# Patient Record
Sex: Female | Born: 1990 | Race: White | Hispanic: No | Marital: Single | Smoking: Never smoker
Health system: Southern US, Community
[De-identification: ages and names within clinical notes are randomized; demographics above are authoritative.]

## PROBLEM LIST (undated history)

## (undated) DIAGNOSIS — N289 Disorder of kidney and ureter, unspecified: Secondary | ICD-10-CM

## (undated) HISTORY — PX: TONSILLECTOMY: SUR1361

---

## 2012-05-17 ENCOUNTER — Encounter (HOSPITAL_COMMUNITY): Payer: Self-pay | Admitting: Emergency Medicine

## 2012-05-17 ENCOUNTER — Emergency Department (HOSPITAL_COMMUNITY): Payer: BC Managed Care – PPO

## 2012-05-17 ENCOUNTER — Emergency Department (HOSPITAL_COMMUNITY)
Admission: EM | Admit: 2012-05-17 | Discharge: 2012-05-17 | Disposition: A | Discharge status: Home / Self Care | Payer: BC Managed Care – PPO | Attending: Emergency Medicine | Admitting: Emergency Medicine

## 2012-05-17 DIAGNOSIS — R319 Hematuria, unspecified: Secondary | ICD-10-CM | POA: Insufficient documentation

## 2012-05-17 DIAGNOSIS — R109 Unspecified abdominal pain: Secondary | ICD-10-CM

## 2012-05-17 DIAGNOSIS — Z87442 Personal history of urinary calculi: Secondary | ICD-10-CM | POA: Insufficient documentation

## 2012-05-17 DIAGNOSIS — R300 Dysuria: Secondary | ICD-10-CM | POA: Insufficient documentation

## 2012-05-17 HISTORY — DX: Disorder of kidney and ureter, unspecified: N28.9

## 2012-05-17 LAB — POCT I-STAT, CHEM 8
BUN: 15 mg/dL (ref 6–23)
Calcium, Ion: 1.25 mmol/L — ABNORMAL HIGH (ref 1.12–1.23)
Chloride: 106 mEq/L (ref 96–112)
Creatinine, Ser: 1 mg/dL (ref 0.50–1.10)
Glucose, Bld: 91 mg/dL (ref 70–99)
HCT: 38 % (ref 36.0–46.0)
Hemoglobin: 12.9 g/dL (ref 12.0–15.0)
Potassium: 4 mEq/L (ref 3.5–5.1)
Sodium: 138 mEq/L (ref 135–145)
TCO2: 25 mmol/L (ref 0–100)

## 2012-05-17 LAB — URINALYSIS, ROUTINE W REFLEX MICROSCOPIC
Bilirubin Urine: NEGATIVE
Glucose, UA: NEGATIVE mg/dL
Ketones, ur: NEGATIVE mg/dL
Leukocytes, UA: NEGATIVE
Nitrite: NEGATIVE
Protein, ur: NEGATIVE mg/dL
Specific Gravity, Urine: <1.005 — ABNORMAL LOW (ref 1.005–1.030)
Urobilinogen, UA: 0.2 mg/dL (ref 0.0–1.0)
pH: 6 (ref 5.0–8.0)

## 2012-05-17 LAB — URINE MICROSCOPIC-ADD ON

## 2012-05-17 LAB — PREGNANCY, URINE: Preg Test, Ur: NEGATIVE

## 2012-05-17 MED ORDER — HYDROMORPHONE HCL PF 1 MG/ML IJ SOLN
1.0000 mg | Freq: Once | INTRAMUSCULAR | Status: AC
  Administered 2012-05-17: 1 mg via INTRAMUSCULAR
  Filled 2012-05-17: qty 1

## 2012-05-17 MED ORDER — IBUPROFEN 400 MG PO TABS
600.0000 mg | ORAL_TABLET | Freq: Once | ORAL | Status: AC
  Administered 2012-05-17: 600 mg via ORAL
  Filled 2012-05-17: qty 2

## 2012-05-17 NOTE — ED Provider Notes (Signed)
History     This chart was scribed for Raeford Razor, MD, MD by Smitty Pluck, ED Scribe. The patient was seen in room APA19/APA19 and the patient's care was started at 3:28 PM.   CSN: [id number]  Arrival date & time 05/17/12  1514      Chief Complaint  Patient presents with  . Flank Pain    The history is provided by the patient and a parent. No language interpreter was used.   Michele Wong is a 22 y.o. female with hx of kidney stones who presents to the Emergency Department complaining of intermittent, moderate left flank pain onset 4 days ago. Pt reports that she passed a kidney stone 4 days ago. She reports that she normally passes the kidney stones. She states she was seen at [facility] center and diagnosed with kidney stones. She reports that different movements aggravates the pain. She mentions having mild hematuria, dysuria and difficulty urinating. She has taken Lortab without relief. Pt denies fever, chills, nausea, vomiting, diarrhea, weakness, cough, SOB and any other pain.   Past Medical History  Diagnosis Date  . Renal disorder     Past Surgical History  Procedure Laterality Date  . Tonsillectomy      History reviewed. No pertinent family history.  History  Substance Use Topics  . Smoking status: Never Smoker   . Smokeless tobacco: Not on file  . Alcohol Use: Yes     Comment: occ    OB History   Grav Para Term Preterm Abortions TAB SAB Ect Mult Living                  Review of Systems  All other systems reviewed and are negative.    Allergies  Review of patient's allergies indicates not on file.  Home Medications  No current outpatient prescriptions on file.  BP 121/78  Pulse 106  Temp(Src) 97.7 F (36.5 C) (Oral)  Resp 24  Ht 5\' 6"  (1.676 m)  Wt 113 lb (51.256 kg)  BMI 18.25 kg/m2  SpO2 100%  LMP 04/29/2012  Physical Exam  Nursing note and vitals reviewed. Constitutional: She appears well-developed and well-nourished. No distress.   HENT:  Head: Normocephalic and atraumatic.  Eyes: Conjunctivae are normal. Right eye exhibits no discharge. Left eye exhibits no discharge.  Neck: Neck supple.  Cardiovascular: Normal rate, regular rhythm and normal heart sounds.  Exam reveals no gallop and no friction rub.   No murmur heard. Pulmonary/Chest: Effort normal and breath sounds normal. No respiratory distress.  Abdominal: Soft. She exhibits no distension. There is no tenderness. There is no CVA tenderness.  Musculoskeletal: She exhibits no edema and no tenderness.  Neurological: She is alert.  Skin: Skin is warm and dry.  Psychiatric: She has a normal mood and affect. Her behavior is normal. Thought content normal.    ED Course  Procedures (including critical care time) DIAGNOSTIC STUDIES: Oxygen Saturation is 100% on room air, normal by my interpretation.    COORDINATION OF CARE: 3:31 PM Discussed ED treatment with pt and pt agrees.     Labs Reviewed  URINALYSIS, ROUTINE W REFLEX MICROSCOPIC - Abnormal; Notable for the following:    Specific Gravity, Urine <1.005 (*)    Hgb urine dipstick MODERATE (*)    All other components within normal limits  POCT I-STAT, CHEM 8 - Abnormal; Notable for the following:    Calcium, Ion 1.25 (*)    All other components within normal limits  PREGNANCY, URINE  URINE MICROSCOPIC-ADD ON   Ct Abdomen Pelvis Wo Contrast  05/17/2012  *RADIOLOGY REPORT*  Clinical Data: Left flank pain.  History of kidney stones.  CT ABDOMEN AND PELVIS WITHOUT CONTRAST  Technique:  Multidetector CT imaging of the abdomen and pelvis was performed following the standard protocol without intravenous contrast.  Comparison: None.  Findings: The lung bases are clear.  There is no pleural effusion.  There are several small renal calyceal calculi bilaterally.  Both renal pelves are mildly prominent.  However, there is no ureteral dilatation or perinephric soft tissue stranding.  There is no evidence of ureteral or  bladder calculus.  No focal renal abnormalities are identified.  As evaluated in the noncontrast state, the liver, spleen, gallbladder, pancreas and adrenal glands appear normal.  There is high density stool throughout the colon.  No bowel wall thickening or surrounding inflammatory change is seen.  The appendix appears normal.  The uterus and ovaries appear normal.  There is a small amount of free pelvic fluid.  IMPRESSION:  1.  Bilateral nephrolithiasis. 2.  No evidence of hydronephrosis, ureteral calculus or other acute process.   Original Report Authenticated By: Carey Bullocks, M.D.      1. Left flank pain       MDM  21yf with continued L flank after passing kidney stone. CT with b/l renal stones but no ureterolithiasis. Renal function fine. UA not suggestive of infection. Possibly continued ureteral spasm. Symptomatic tx with NSAIDs. Return precautions discussed. Outpt FU otherwise.      I personally preformed the services scribed in my presence. The recorded information has been reviewed is accurate. Raeford Razor, MD.    Raeford Razor, MD 05/17/12 [phone]

## 2012-05-17 NOTE — ED Notes (Signed)
Dr Kohut at bedside,  

## 2012-05-17 NOTE — ED Notes (Signed)
Went in to room to discharge pt, pt states that she needs stronger pain medication, pt taking lortab 5/325mg  with no improvement in pain, Dr. Juleen China back to room speaking with pt and family member,

## 2012-05-17 NOTE — ED Notes (Signed)
Pt has hx of kidney stones. C/o L flank pain since Wednesday and passed one then. Pain started back Friday. Urine hard to come out. Nad at this time.

## 2012-05-17 NOTE — ED Notes (Signed)
Pt c/o left flank pain, states that she passed a kidney stone this past Wednesday, was seen at [facility], pain returned today,

## 2013-03-25 ENCOUNTER — Emergency Department (HOSPITAL_COMMUNITY)
Admission: EM | Admit: 2013-03-25 | Discharge: 2013-03-25 | Disposition: A | Discharge status: Home / Self Care | Payer: BC Managed Care – PPO | Attending: Emergency Medicine | Admitting: Emergency Medicine

## 2013-03-25 ENCOUNTER — Encounter (HOSPITAL_COMMUNITY): Payer: Self-pay | Admitting: Emergency Medicine

## 2013-03-25 ENCOUNTER — Emergency Department (HOSPITAL_COMMUNITY): Payer: BC Managed Care – PPO

## 2013-03-25 DIAGNOSIS — Z79899 Other long term (current) drug therapy: Secondary | ICD-10-CM | POA: Insufficient documentation

## 2013-03-25 DIAGNOSIS — R110 Nausea: Secondary | ICD-10-CM | POA: Insufficient documentation

## 2013-03-25 DIAGNOSIS — K625 Hemorrhage of anus and rectum: Secondary | ICD-10-CM | POA: Insufficient documentation

## 2013-03-25 DIAGNOSIS — K5900 Constipation, unspecified: Secondary | ICD-10-CM | POA: Insufficient documentation

## 2013-03-25 DIAGNOSIS — Z87448 Personal history of other diseases of urinary system: Secondary | ICD-10-CM | POA: Insufficient documentation

## 2013-03-25 DIAGNOSIS — R6883 Chills (without fever): Secondary | ICD-10-CM | POA: Insufficient documentation

## 2013-03-25 MED ORDER — PEG 3350-KCL-NABCB-NACL-NASULF 236 G PO SOLR: 2.0000 L | Freq: Once | ORAL | Status: AC

## 2013-03-25 MED ORDER — DOCUSATE SODIUM 100 MG PO CAPS: 100.0000 mg | ORAL_CAPSULE | Freq: Two times a day (BID) | ORAL | Status: AC

## 2013-03-25 MED ORDER — MAGNESIUM CITRATE PO SOLN
1.0000 | Freq: Once | ORAL | Status: AC
Start: 2013-03-25 — End: ?

## 2013-03-25 NOTE — Discharge Instructions (Signed)

## 2013-03-25 NOTE — ED Notes (Signed)
MD at bedside. 

## 2013-03-25 NOTE — ED Provider Notes (Signed)
CSN: [id number]     Arrival date & time 03/25/13  1933 History  This chart was scribed for Gilda Creasehristopher J. Pollina, MD by Quintella ReichertMatthew Underwood, ED scribe.  This patient was seen in room APA19/APA19 and the patient's care was started at 7:50 PM.   Chief Complaint  Patient presents with  . Constipation    The history is provided by the patient. No language interpreter was used.    HPI Comments: Michele ServeSara Chisenhall is a 23 y.o. female who presents to the Emergency Department complaining of 2 weeks of persistent constipation.  Pt states her last BM was about 2 weeks ago.  She states she feels as if she has to move her bowels but she is unable to.  She has been straining, and feels this may have caused a hemorrhoid with some associated rectal bleeding.  She has had no relief from OTC laxatives, stool softeners, or enemas.  She last used an enema 2.5 hours ago.  She also states she feels nauseated and bloated but denies vomiting.  She states she feels chilled but denies measured fever.  Pt admits to prior issues with constipation but never as severe as at present.   Past Medical History  Diagnosis Date  . Renal disorder     Past Surgical History  Procedure Laterality Date  . Tonsillectomy      History reviewed. No pertinent family history.   History  Substance Use Topics  . Smoking status: Never Smoker   . Smokeless tobacco: Not on file  . Alcohol Use: Yes     Comment: occ    OB History   Grav Para Term Preterm Abortions TAB SAB Ect Mult Living                  Review of Systems  Constitutional: Positive for chills. Negative for fever.  Gastrointestinal: Positive for nausea, constipation, anal bleeding and rectal pain. Negative for vomiting.  All other systems reviewed and are negative.     Allergies  Review of patient's allergies indicates no known allergies.  Home Medications   Current Outpatient Rx  Name  Route  Sig  Dispense  Refill  . escitalopram (LEXAPRO) 20 MG tablet  Oral   Take 20 mg by mouth daily.         Marland Kitchen. HYDROcodone-acetaminophen (NORCO/VICODIN) 5-325 MG per tablet   Oral   Take 1 tablet by mouth every 6 (six) hours as needed for pain.         . norgestimate-ethinyl estradiol (ORTHO-CYCLEN,SPRINTEC,PREVIFEM) 0.25-35 MG-MCG tablet   Oral   Take 1 tablet by mouth daily.          BP 120/86  Pulse 87  Temp(Src) 97.5 F (36.4 C) (Oral)  Resp 18  Ht 5\' 6"  (1.676 m)  Wt 122 lb (55.339 kg)  BMI 19.70 kg/m2  SpO2 94%  LMP 03/04/2013  Physical Exam  Nursing note and vitals reviewed. Constitutional: She is oriented to person, place, and time. She appears well-developed and well-nourished. No distress.  HENT:  Head: Normocephalic and atraumatic.  Right Ear: Hearing normal.  Left Ear: Hearing normal.  Nose: Nose normal.  Mouth/Throat: Oropharynx is clear and moist and mucous membranes are normal.  Eyes: Conjunctivae and EOM are normal. Pupils are equal, round, and reactive to light.  Neck: Normal range of motion. Neck supple.  Cardiovascular: Regular rhythm, S1 normal and S2 normal.  Exam reveals no gallop and no friction rub.   No murmur heard. Pulmonary/Chest: Effort normal  and breath sounds normal. No respiratory distress. She exhibits no tenderness.  Abdominal: Soft. Normal appearance. She exhibits no distension. Bowel sounds are increased. There is no hepatosplenomegaly. There is no tenderness. There is no rebound, no guarding, no tenderness at McBurney's point and negative Murphy's sign. No hernia.  Genitourinary:  Rectal exam: Partial soft impaction.  No hemorrhoids.  No fissure.  Musculoskeletal: Normal range of motion.  Neurological: She is alert and oriented to person, place, and time. She has normal strength. No cranial nerve deficit or sensory deficit. Coordination normal. GCS eye subscore is 4. GCS verbal subscore is 5. GCS motor subscore is 6.  Skin: Skin is warm, dry and intact. No rash noted. No cyanosis.  Psychiatric:  She has a normal mood and affect. Her speech is normal and behavior is normal. Thought content normal.    ED Course  Procedures (including critical care time)  DIAGNOSTIC STUDIES: Oxygen Saturation is 94% on room air, adequate by my interpretation.    COORDINATION OF CARE: 7:56 PM-Discussed treatment plan which includes rectal exam, enema and abdomen x-ray with pt at bedside and pt agreed to plan.    Labs Review Labs Reviewed - No data to display  Imaging Review Dg Abd Acute W/chest  03/25/2013   CLINICAL DATA:  Abdominal pain  EXAM: ACUTE ABDOMEN SERIES (ABDOMEN 2 VIEW & CHEST 1 VIEW)  COMPARISON:  None.  FINDINGS: There is no evidence of dilated bowel loops or free intraperitoneal air. No radiopaque calculi or other significant radiographic abnormality is seen. Heart size and mediastinal contours are within normal limits. Both lungs are clear. Moderate-to-large amount of stool.  IMPRESSION: Negative abdominal radiographs. No acute cardiopulmonary disease. Moderate-to-large amount of stool appreciated. This may reflect sequela of constipation clinically appropriate.   Electronically Signed   By: Salome Holmes M.D.   On: 03/25/2013 20:22    EKG Interpretation   None       MDM  Diagnosis: Constipation  Patient presents to the ER for evaluation of constipation. Patient reports that she has not had a bowel movement for 2 weeks. She feels like she might have a hemorrhoid, because she is having pain on the right side of her rectum. She has been passing some blood. Examination reveals no external hemorrhoids or fissures. Rectal exam reveals a large amount of stool in the rectum, but this is soft. X-ray confirms significant stool burden throughout the colon. Patient administered Fleet Enema here in the ER, will be treated with magnesium citrate, GoLytely. Follow up with GI.   I personally performed the services described in this documentation, which was scribed in my presence. The recorded  information has been reviewed and is accurate.     Gilda Crease, MD 03/25/13 2030

## 2013-03-25 NOTE — ED Notes (Signed)
Family at bedside. 

## 2013-03-25 NOTE — ED Notes (Signed)
Pt reports "severe constipation".  States last BM was about 2 weeks ago.  Reports no relief from OTC laxatives, stool softeners, or enemas.

## 2014-07-14 IMAGING — CR DG ABDOMEN ACUTE W/ 1V CHEST
4 series · 4 of 4 positions shown · non-contrast
Comparison: None.

CLINICAL DATA: Abdominal pain

EXAM:
ACUTE ABDOMEN SERIES (ABDOMEN 2 VIEW & CHEST 1 VIEW)

[view not recorded (1 of 4)]
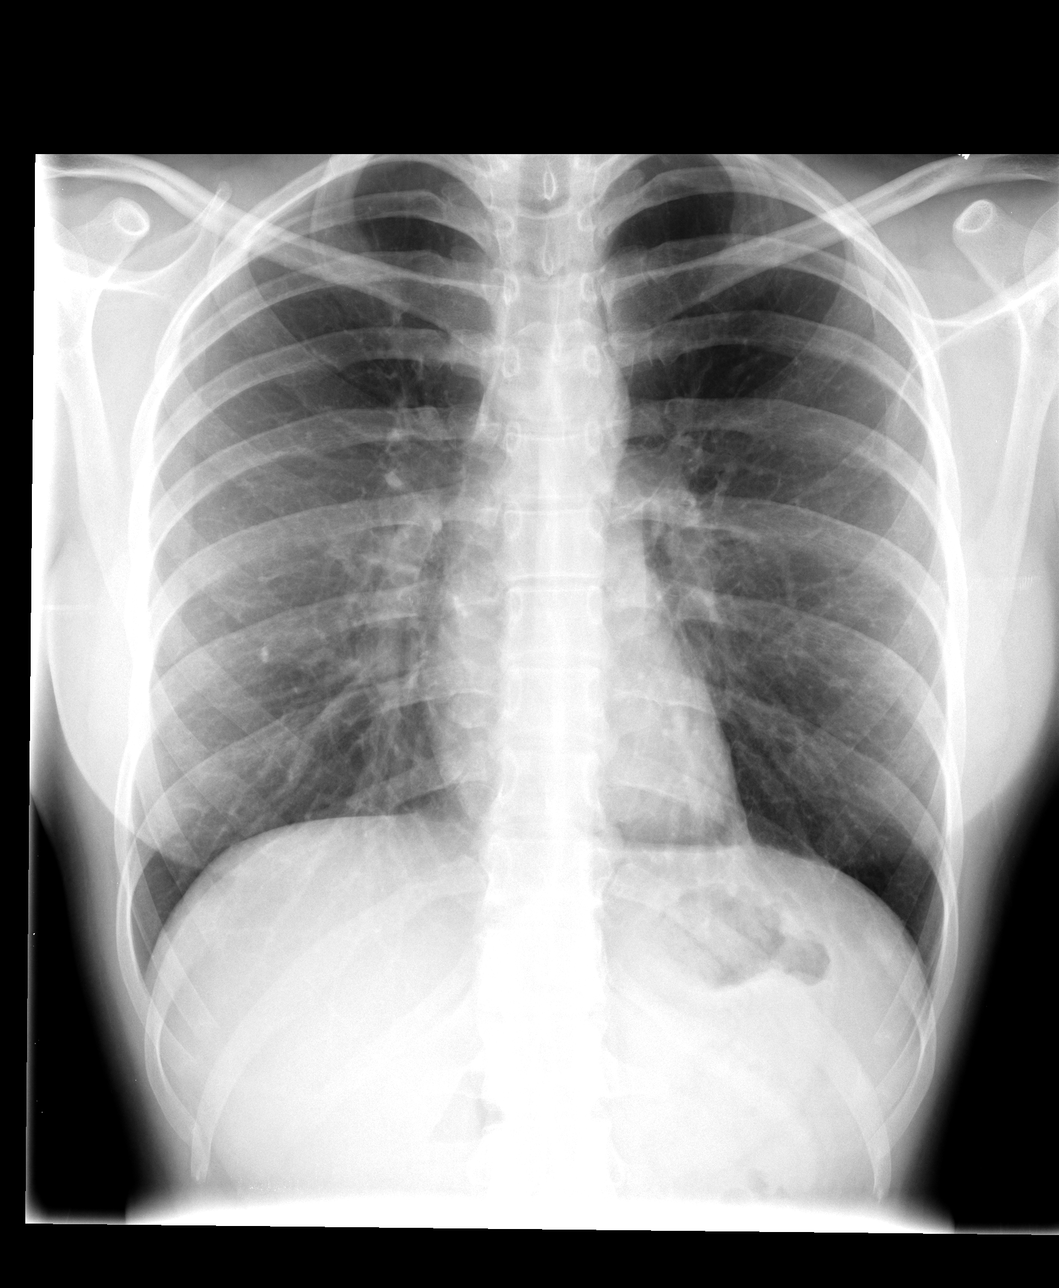

[view not recorded (2 of 4)]
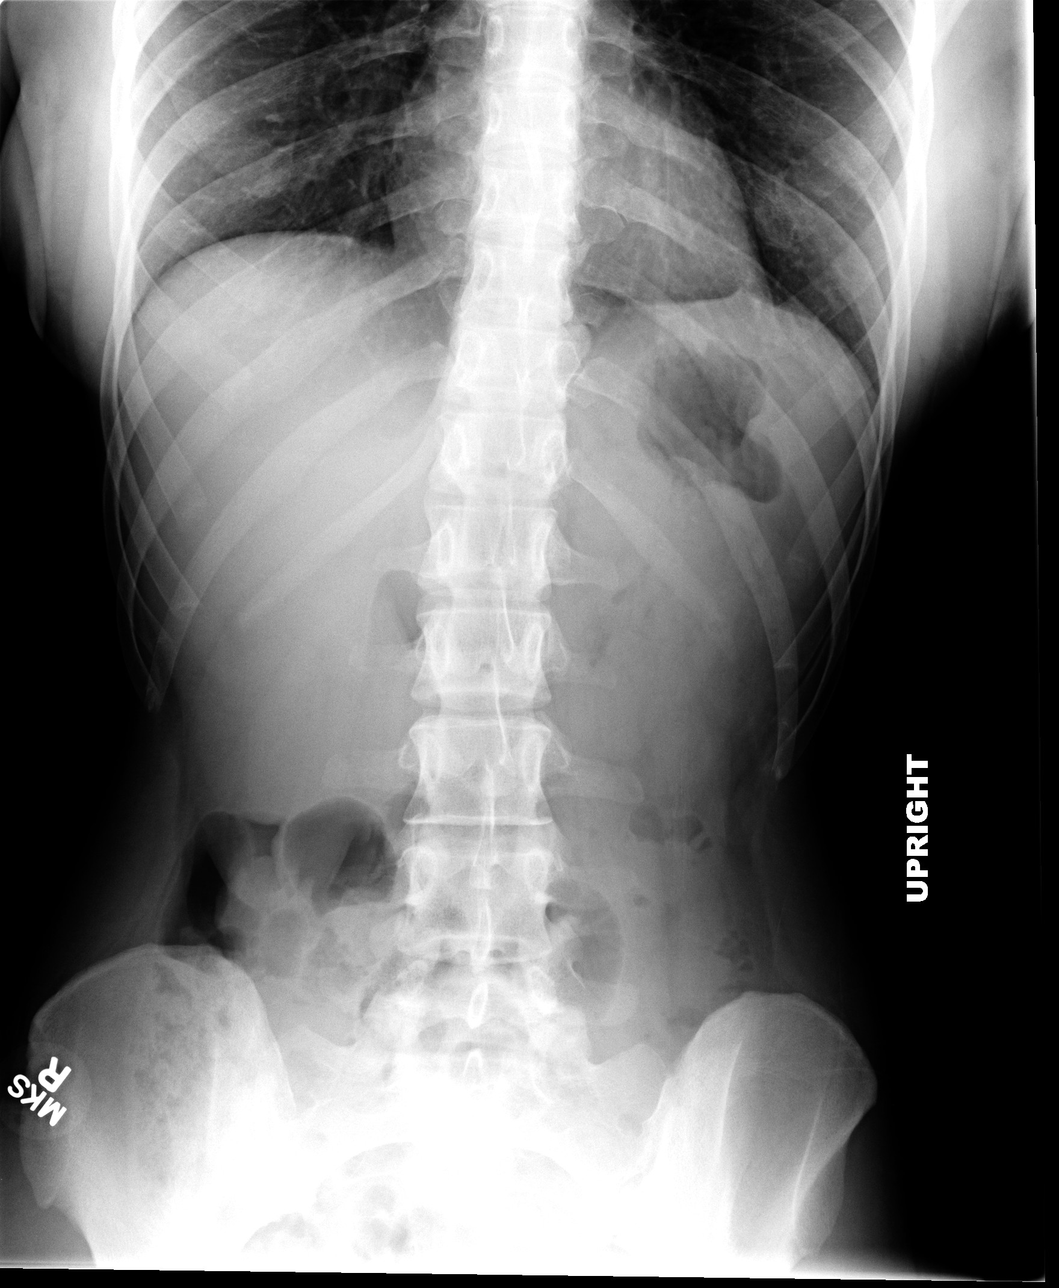

[view not recorded (3 of 4)]
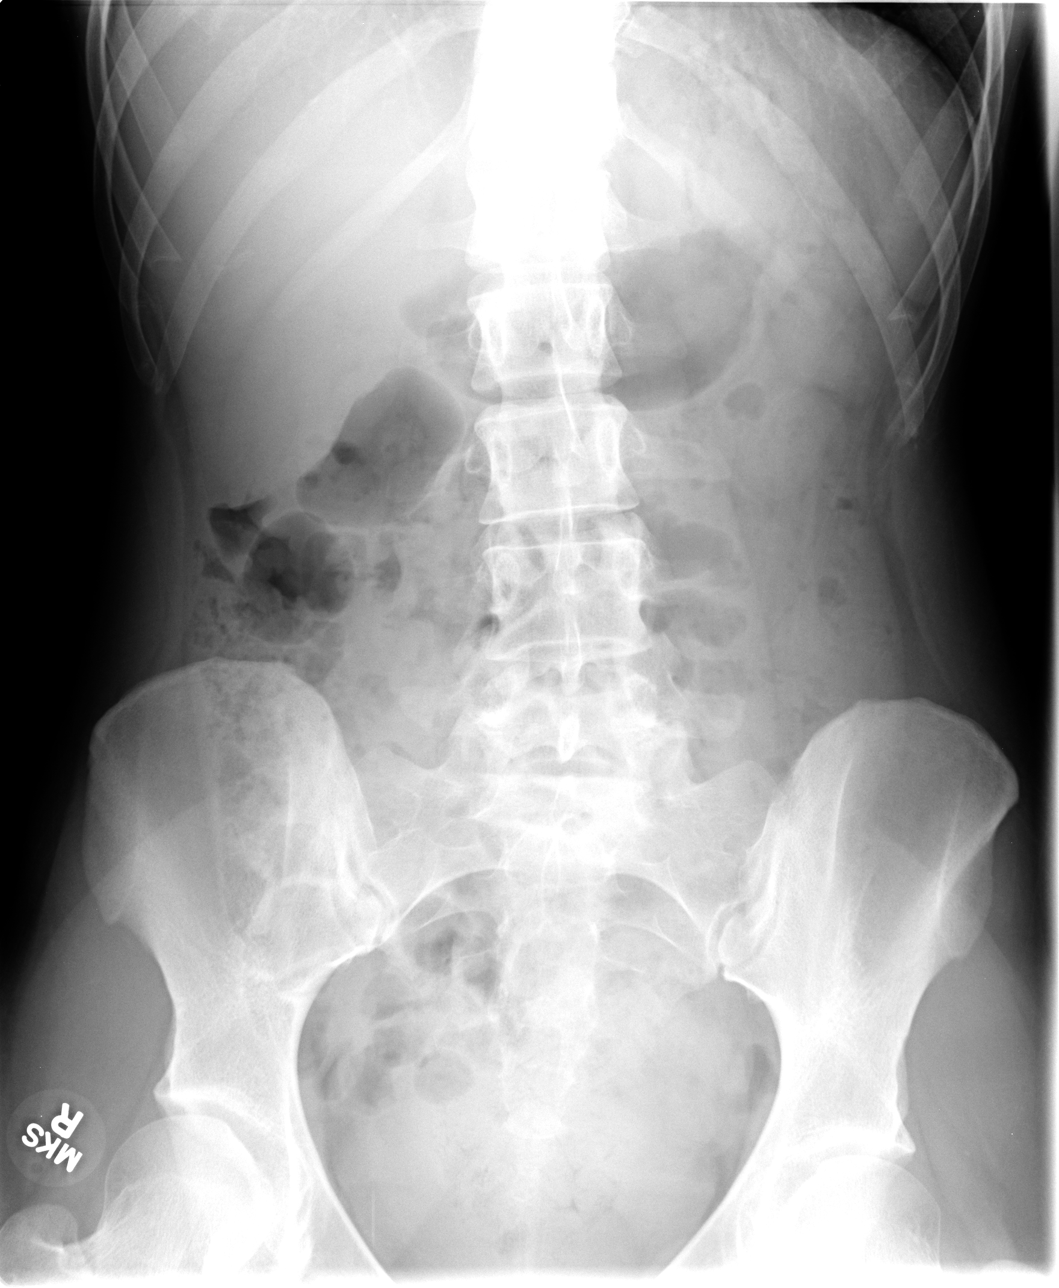

[view not recorded (4 of 4)]
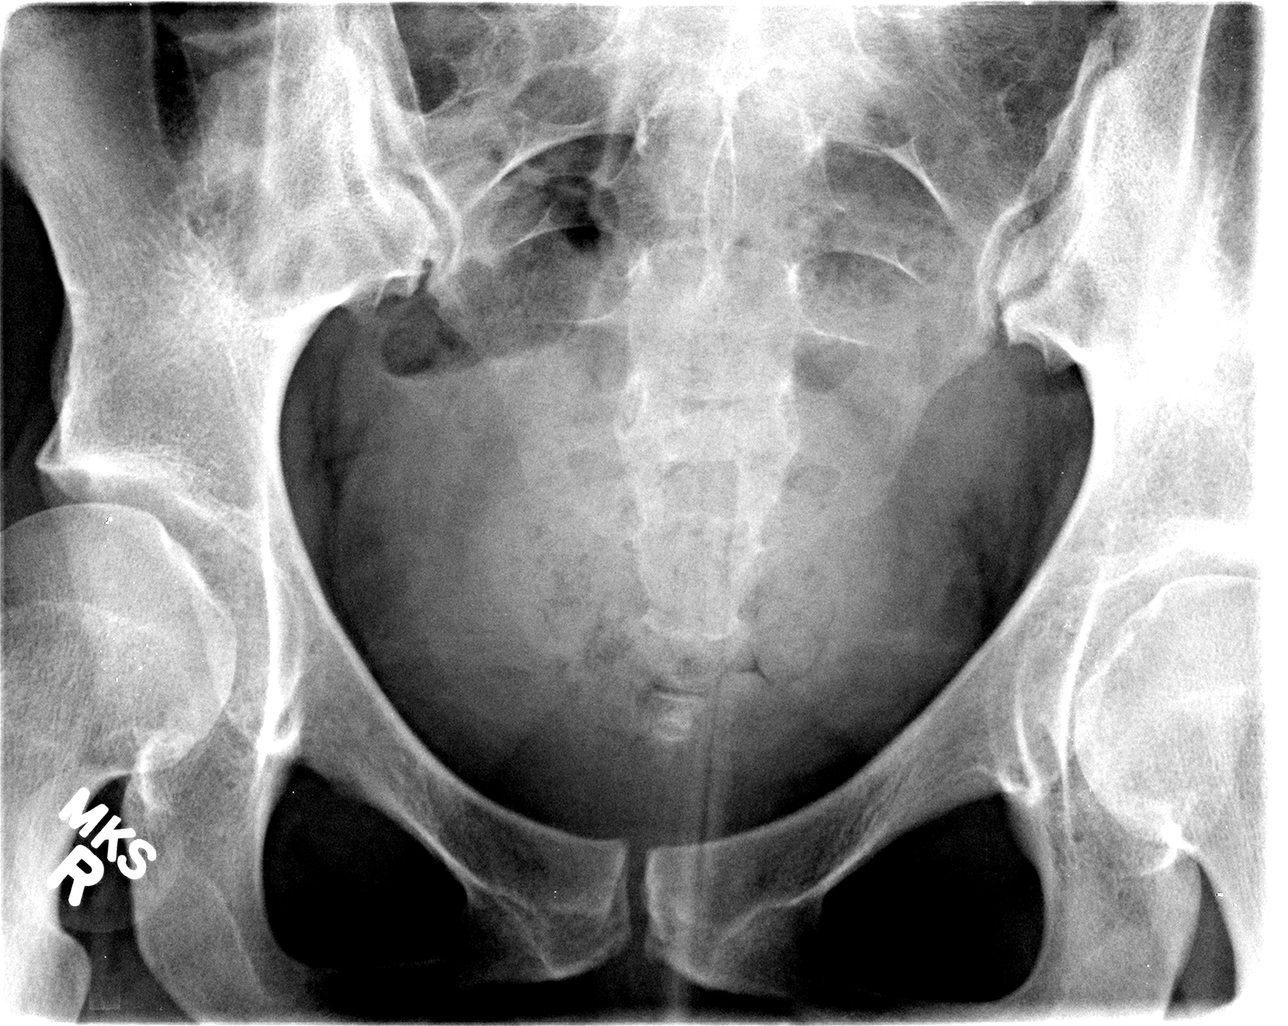

[4 of 4 positions shown; findings below may reference images not displayed]

FINDINGS: There is no evidence of dilated bowel loops or free intraperitoneal
air. No radiopaque calculi or other significant radiographic
abnormality is seen. Heart size and mediastinal contours are within
normal limits. Both lungs are clear. Moderate-to-large amount of
stool.
IMPRESSION: Negative abdominal radiographs. No acute cardiopulmonary disease.
Moderate-to-large amount of stool appreciated. This may reflect
sequela of constipation clinically appropriate.
